# Patient Record
Sex: Male | Born: 1990 | Race: White | Hispanic: No | Marital: Single | Smoking: Never smoker
Health system: Southern US, Community
[De-identification: ages and names within clinical notes are randomized; demographics above are authoritative.]

## PROBLEM LIST (undated history)

## (undated) DIAGNOSIS — Z87898 Personal history of other specified conditions: Secondary | ICD-10-CM

## (undated) DIAGNOSIS — K4090 Unilateral inguinal hernia, without obstruction or gangrene, not specified as recurrent: Secondary | ICD-10-CM

## (undated) DIAGNOSIS — M26629 Arthralgia of temporomandibular joint, unspecified side: Secondary | ICD-10-CM

## (undated) HISTORY — PX: HERNIA REPAIR: SHX51

---
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
## 2013-01-17 ENCOUNTER — Other Ambulatory Visit: Payer: Self-pay | Admitting: *Deleted

## 2013-01-17 DIAGNOSIS — R1030 Lower abdominal pain, unspecified: Secondary | ICD-10-CM

## 2013-01-19 ENCOUNTER — Other Ambulatory Visit: Payer: Self-pay | Admitting: *Deleted

## 2013-01-19 DIAGNOSIS — R1030 Lower abdominal pain, unspecified: Secondary | ICD-10-CM

## 2013-01-20 ENCOUNTER — Other Ambulatory Visit: Payer: Self-pay

## 2013-01-25 ENCOUNTER — Ambulatory Visit
Admission: RE | Admit: 2013-01-25 | Discharge: 2013-01-25 | Disposition: A | Discharge status: Home / Self Care | Payer: Managed Care, Other (non HMO) | Source: Ambulatory Visit | Attending: *Deleted | Admitting: *Deleted

## 2013-01-25 DIAGNOSIS — R1030 Lower abdominal pain, unspecified: Secondary | ICD-10-CM

## 2013-01-30 ENCOUNTER — Encounter (INDEPENDENT_AMBULATORY_CARE_PROVIDER_SITE_OTHER): Payer: Self-pay | Admitting: Surgery

## 2013-01-30 DIAGNOSIS — K4090 Unilateral inguinal hernia, without obstruction or gangrene, not specified as recurrent: Secondary | ICD-10-CM

## 2013-01-30 HISTORY — DX: Unilateral inguinal hernia, without obstruction or gangrene, not specified as recurrent: K40.90

## 2013-01-31 ENCOUNTER — Ambulatory Visit (INDEPENDENT_AMBULATORY_CARE_PROVIDER_SITE_OTHER): Payer: Managed Care, Other (non HMO) | Admitting: Surgery

## 2013-02-02 ENCOUNTER — Ambulatory Visit (INDEPENDENT_AMBULATORY_CARE_PROVIDER_SITE_OTHER): Payer: Managed Care, Other (non HMO) | Admitting: General Surgery

## 2013-02-10 ENCOUNTER — Encounter (INDEPENDENT_AMBULATORY_CARE_PROVIDER_SITE_OTHER): Payer: Self-pay | Admitting: Surgery

## 2013-02-10 ENCOUNTER — Encounter (INDEPENDENT_AMBULATORY_CARE_PROVIDER_SITE_OTHER): Payer: Self-pay

## 2013-02-10 ENCOUNTER — Ambulatory Visit (INDEPENDENT_AMBULATORY_CARE_PROVIDER_SITE_OTHER): Payer: Managed Care, Other (non HMO) | Admitting: Surgery

## 2013-02-10 VITALS — BP 124/62 | HR 74 | Temp 98.0°F | Resp 18 | Ht 72.0 in | Wt 202.0 lb

## 2013-02-10 DIAGNOSIS — K4090 Unilateral inguinal hernia, without obstruction or gangrene, not specified as recurrent: Secondary | ICD-10-CM | POA: Insufficient documentation

## 2013-02-10 NOTE — Progress Notes (Signed)
Patient ID: Jaime Glover, male   DOB: February 22, 1991, 22 y.o.   MRN: [id number]  Chief Complaint  Patient presents with  . New Evaluation    hernia    HPI Jaime Glover is a 22 y.o. male.  Referred by Jaime Fairly, MD at [facility] for [name]  HPI This is a 22 year old male who is a senior at [employer] who was recently working out when he noticed of folds in his left groin. This area remains reducible. It causes some discomfort when he is standing up. He denies any obstructive symptoms. He had an ultrasound that confirmed a left inguinal hernia. He is now referred for surgical evaluation. He is home in [place] on Christmas break.  History reviewed. No pertinent past medical history.  Past Surgical History  Procedure Laterality Date  . Shoulder arthroscopy  2008    both  . Hand surgery  2010    right hand fx    History reviewed. No pertinent family history.  Social History History  Substance Use Topics  . Smoking status: Never Smoker   . Smokeless tobacco: Not on file  . Alcohol Use: Not on file    No Known Allergies  Current Outpatient Prescriptions  Medication Sig Dispense Refill  . doxycycline (VIBRAMYCIN) 100 MG capsule       . EPIDUO 0.1-2.5 % gel        No current facility-administered medications for this visit.    Review of Systems Review of Systems  Constitutional: Negative for fever, chills and unexpected weight change.  HENT: Negative for congestion, hearing loss, sore throat, trouble swallowing and voice change.   Eyes: Negative for visual disturbance.  Respiratory: Negative for cough and wheezing.   Cardiovascular: Negative for chest pain, palpitations and leg swelling.  Gastrointestinal: Negative for nausea, vomiting, abdominal pain, diarrhea, constipation, blood in stool, abdominal distention, anal bleeding and rectal pain.  Genitourinary: Positive for scrotal swelling. Negative for hematuria and difficulty urinating.   Musculoskeletal: Negative for arthralgias.  Skin: Negative for rash and wound.  Neurological: Negative for seizures, syncope, weakness and headaches.  Hematological: Negative for adenopathy. Does not bruise/bleed easily.  Psychiatric/Behavioral: Negative for confusion.    Blood pressure 124/62, pulse 74, temperature 98 F (36.7 C), resp. rate 18, height 6' (1.829 m), weight 202 lb (91.627 kg).  Physical Exam Physical Exam WDWN in NAD HEENT:  EOMI, sclera anicteric Neck:  No masses, no thyromegaly Lungs:  CTA bilaterally; normal respiratory effort CV:  Regular rate and rhythm; no murmurs Abd:  +bowel sounds, soft, non-tender, no masses GU:  Bilateral descended testes; no testicular masses; reducible left inguinal hernia; no right inguinal hernia Ext:  Well-perfused; no edema Skin:  Warm, dry; no sign of jaundice  Data Reviewed US Pelvis Limited  01/25/2013   CLINICAL DATA:  Bulging in left inguinal region, evaluate for hernia  EXAM: US PELVIS LIMITED  TECHNIQUE: Ultrasound examination of the pelvic soft tissues was performed in the area of clinical concern.  COMPARISON:  None.  FINDINGS: Initial evaluation of the left inguinal region is without sonographic abnormality.  However, with Valsalva, a small fat containing left inguinal hernia is suspected.  No mass or adenopathy is visualized.  IMPRESSION: Suspected small fat containing left inguinal hernia with Valsalva.   Electronically Signed   By: Charline Bills M.D.   On: 01/25/2013 08:46      Assessment    Left inguinal hernia - reducible     Plan  Left inguinal hernia repair with mesh.  The surgical procedure has been discussed with the patient.  Potential risks, benefits, alternative treatments, and expected outcomes have been explained.  All of the patient's questions at this time have been answered.  The likelihood of reaching the patient's treatment goal is good.  The patient understand the proposed surgical procedure  and wishes to proceed.         Kye Silverstein K. 02/10/2013, 11:10 AM

## 2013-02-21 ENCOUNTER — Encounter (HOSPITAL_BASED_OUTPATIENT_CLINIC_OR_DEPARTMENT_OTHER): Payer: Self-pay | Admitting: *Deleted

## 2013-03-01 ENCOUNTER — Encounter (HOSPITAL_BASED_OUTPATIENT_CLINIC_OR_DEPARTMENT_OTHER)
Admission: RE | Disposition: A | Discharge status: Home / Self Care | Payer: Self-pay | Source: Ambulatory Visit | Attending: Surgery

## 2013-03-01 ENCOUNTER — Encounter (HOSPITAL_BASED_OUTPATIENT_CLINIC_OR_DEPARTMENT_OTHER): Payer: Self-pay

## 2013-03-01 ENCOUNTER — Ambulatory Visit (HOSPITAL_BASED_OUTPATIENT_CLINIC_OR_DEPARTMENT_OTHER): Payer: Managed Care, Other (non HMO) | Admitting: Anesthesiology

## 2013-03-01 ENCOUNTER — Ambulatory Visit (HOSPITAL_BASED_OUTPATIENT_CLINIC_OR_DEPARTMENT_OTHER)
Admission: RE | Admit: 2013-03-01 | Discharge: 2013-03-01 | Disposition: A | Discharge status: Home / Self Care | Payer: Managed Care, Other (non HMO) | Source: Ambulatory Visit | Attending: Surgery | Admitting: Surgery

## 2013-03-01 ENCOUNTER — Encounter (HOSPITAL_BASED_OUTPATIENT_CLINIC_OR_DEPARTMENT_OTHER): Payer: Managed Care, Other (non HMO) | Admitting: Anesthesiology

## 2013-03-01 DIAGNOSIS — K4090 Unilateral inguinal hernia, without obstruction or gangrene, not specified as recurrent: Secondary | ICD-10-CM

## 2013-03-01 HISTORY — DX: Unilateral inguinal hernia, without obstruction or gangrene, not specified as recurrent: K40.90

## 2013-03-01 HISTORY — PX: INSERTION OF MESH: SHX5868

## 2013-03-01 HISTORY — DX: Personal history of other specified conditions: Z87.898

## 2013-03-01 HISTORY — PX: INGUINAL HERNIA REPAIR: SHX194

## 2013-03-01 HISTORY — DX: Arthralgia of temporomandibular joint, unspecified side: M26.629

## 2013-03-01 SURGERY — REPAIR, HERNIA, INGUINAL, ADULT
Anesthesia: General | Site: Abdomen | Laterality: Left

## 2013-03-01 MED ORDER — CEFAZOLIN SODIUM 1-5 GM-% IV SOLN
INTRAVENOUS | Status: AC
  Filled 2013-03-01: qty 100

## 2013-03-01 MED ORDER — LACTATED RINGERS IV SOLN
INTRAVENOUS | Status: DC
  Administered 2013-03-01 (×2): via INTRAVENOUS

## 2013-03-01 MED ORDER — MIDAZOLAM HCL 2 MG/2ML IJ SOLN
INTRAMUSCULAR | Status: AC
  Filled 2013-03-01: qty 2

## 2013-03-01 MED ORDER — OXYCODONE HCL 5 MG/5ML PO SOLN: 5.0000 mg | Freq: Once | ORAL | Status: DC | PRN

## 2013-03-01 MED ORDER — OXYCODONE HCL 5 MG PO TABS
10.0000 mg | ORAL_TABLET | Freq: Once | ORAL | Status: AC | PRN
  Administered 2013-03-01: 10 mg via ORAL

## 2013-03-01 MED ORDER — MIDAZOLAM HCL 2 MG/ML PO SYRP: 12.0000 mg | ORAL_SOLUTION | Freq: Once | ORAL | Status: DC | PRN

## 2013-03-01 MED ORDER — ONDANSETRON HCL 4 MG/2ML IJ SOLN: 4.0000 mg | INTRAMUSCULAR | Status: DC | PRN

## 2013-03-01 MED ORDER — FENTANYL CITRATE 0.05 MG/ML IJ SOLN
50.0000 ug | INTRAMUSCULAR | Status: DC | PRN
  Administered 2013-03-01: 100 ug via INTRAVENOUS

## 2013-03-01 MED ORDER — CHLORHEXIDINE GLUCONATE 4 % EX LIQD: 1.0000 "application " | Freq: Once | CUTANEOUS | Status: DC

## 2013-03-01 MED ORDER — HYDROCODONE-IBUPROFEN 10-200 MG PO TABS: 1.0000 | ORAL_TABLET | ORAL | Status: DC | PRN

## 2013-03-01 MED ORDER — BUPIVACAINE-EPINEPHRINE PF 0.5-1:200000 % IJ SOLN
INTRAMUSCULAR | Status: DC | PRN
  Administered 2013-03-01: 25 mL via PERINEURAL

## 2013-03-01 MED ORDER — ONDANSETRON HCL 4 MG/2ML IJ SOLN
INTRAMUSCULAR | Status: DC | PRN
  Administered 2013-03-01: 4 mg via INTRAVENOUS

## 2013-03-01 MED ORDER — CEFAZOLIN SODIUM-DEXTROSE 2-3 GM-% IV SOLR
2.0000 g | INTRAVENOUS | Status: AC
  Administered 2013-03-01: 2 g via INTRAVENOUS

## 2013-03-01 MED ORDER — BUPIVACAINE HCL (PF) 0.25 % IJ SOLN
INTRAMUSCULAR | Status: AC
  Filled 2013-03-01: qty 30

## 2013-03-01 MED ORDER — DEXAMETHASONE SODIUM PHOSPHATE 4 MG/ML IJ SOLN
INTRAMUSCULAR | Status: DC | PRN
  Administered 2013-03-01: 10 mg via INTRAVENOUS

## 2013-03-01 MED ORDER — OXYCODONE HCL 5 MG PO TABS: 5.0000 mg | ORAL_TABLET | Freq: Once | ORAL | Status: DC | PRN

## 2013-03-01 MED ORDER — BUPIVACAINE-EPINEPHRINE 0.5% -1:200000 IJ SOLN
INTRAMUSCULAR | Status: DC | PRN
  Administered 2013-03-01: 10 mL

## 2013-03-01 MED ORDER — MIDAZOLAM HCL 2 MG/2ML IJ SOLN
1.0000 mg | INTRAMUSCULAR | Status: DC | PRN
  Administered 2013-03-01: 2 mg via INTRAVENOUS

## 2013-03-01 MED ORDER — MIDAZOLAM HCL 5 MG/5ML IJ SOLN
INTRAMUSCULAR | Status: DC | PRN
  Administered 2013-03-01: 2 mg via INTRAVENOUS

## 2013-03-01 MED ORDER — LIDOCAINE HCL (CARDIAC) 20 MG/ML IV SOLN
INTRAVENOUS | Status: DC | PRN
  Administered 2013-03-01: 50 mg via INTRAVENOUS

## 2013-03-01 MED ORDER — FENTANYL CITRATE 0.05 MG/ML IJ SOLN
INTRAMUSCULAR | Status: DC | PRN
  Administered 2013-03-01: 50 ug via INTRAVENOUS

## 2013-03-01 MED ORDER — OXYCODONE HCL 5 MG PO TABS
ORAL_TABLET | ORAL | Status: AC
  Filled 2013-03-01: qty 2

## 2013-03-01 MED ORDER — BUPIVACAINE-EPINEPHRINE PF 0.5-1:200000 % IJ SOLN
INTRAMUSCULAR | Status: AC
  Filled 2013-03-01: qty 30

## 2013-03-01 MED ORDER — HYDROMORPHONE HCL PF 1 MG/ML IJ SOLN: 0.2500 mg | INTRAMUSCULAR | Status: DC | PRN

## 2013-03-01 MED ORDER — FENTANYL CITRATE 0.05 MG/ML IJ SOLN
INTRAMUSCULAR | Status: AC
  Filled 2013-03-01: qty 6

## 2013-03-01 MED ORDER — PROPOFOL 10 MG/ML IV BOLUS
INTRAVENOUS | Status: DC | PRN
  Administered 2013-03-01: 400 mg via INTRAVENOUS

## 2013-03-01 MED ORDER — OXYCODONE-ACETAMINOPHEN 5-325 MG PO TABS: 1.0000 | ORAL_TABLET | ORAL | Status: DC | PRN

## 2013-03-01 MED ORDER — MORPHINE SULFATE 2 MG/ML IJ SOLN: 2.0000 mg | INTRAMUSCULAR | Status: DC | PRN

## 2013-03-01 MED ORDER — FENTANYL CITRATE 0.05 MG/ML IJ SOLN
INTRAMUSCULAR | Status: AC
  Filled 2013-03-01: qty 2

## 2013-03-01 MED ORDER — ONDANSETRON HCL 4 MG/2ML IJ SOLN: 4.0000 mg | Freq: Once | INTRAMUSCULAR | Status: DC | PRN

## 2013-03-01 SURGICAL SUPPLY — 54 items
APL SKNCLS STERI-STRIP NONHPOA (GAUZE/BANDAGES/DRESSINGS) ×1
BENZOIN TINCTURE PRP APPL 2/3 (GAUZE/BANDAGES/DRESSINGS) ×2 IMPLANT
BLADE HEX COATED 2.75 (ELECTRODE) ×2 IMPLANT
BLADE SURG 15 STRL LF DISP TIS (BLADE) ×1 IMPLANT
BLADE SURG 15 STRL SS (BLADE) ×2
BLADE SURG ROTATE 9660 (MISCELLANEOUS) ×1 IMPLANT
CANISTER SUCT 1200ML W/VALVE (MISCELLANEOUS) IMPLANT
CHLORAPREP W/TINT 26ML (MISCELLANEOUS) ×2 IMPLANT
COVER MAYO STAND STRL (DRAPES) ×2 IMPLANT
COVER TABLE BACK 60X90 (DRAPES) ×2 IMPLANT
DECANTER SPIKE VIAL GLASS SM (MISCELLANEOUS) ×2 IMPLANT
DRAIN PENROSE 1/2X12 LTX STRL (WOUND CARE) ×2 IMPLANT
DRAPE LAPAROTOMY TRNSV 102X78 (DRAPE) ×2 IMPLANT
DRAPE UTILITY XL STRL (DRAPES) ×2 IMPLANT
DRSG TEGADERM 4X4.75 (GAUZE/BANDAGES/DRESSINGS) ×2 IMPLANT
ELECT REM PT RETURN 9FT ADLT (ELECTROSURGICAL) ×2
ELECTRODE REM PT RTRN 9FT ADLT (ELECTROSURGICAL) ×1 IMPLANT
GLOVE BIO SURGEON STRL SZ 6.5 (GLOVE) ×1 IMPLANT
GLOVE BIO SURGEON STRL SZ7 (GLOVE) ×2 IMPLANT
GLOVE BIOGEL PI IND STRL 7.0 (GLOVE) IMPLANT
GLOVE BIOGEL PI IND STRL 7.5 (GLOVE) ×1 IMPLANT
GLOVE BIOGEL PI INDICATOR 7.0 (GLOVE) ×1
GLOVE BIOGEL PI INDICATOR 7.5 (GLOVE) ×1
GLOVE EXAM NITRILE MD LF STRL (GLOVE) ×1 IMPLANT
GOWN PREVENTION PLUS XLARGE (GOWN DISPOSABLE) ×4 IMPLANT
MESH PARIETEX PROGRIP LEFT (Mesh General) ×1 IMPLANT
NDL HYPO 25X1 1.5 SAFETY (NEEDLE) ×1 IMPLANT
NEEDLE HYPO 25X1 1.5 SAFETY (NEEDLE) ×2 IMPLANT
NS IRRIG 1000ML POUR BTL (IV SOLUTION) ×1 IMPLANT
PACK BASIN DAY SURGERY FS (CUSTOM PROCEDURE TRAY) ×2 IMPLANT
PENCIL BUTTON HOLSTER BLD 10FT (ELECTRODE) ×2 IMPLANT
SLEEVE SCD COMPRESS KNEE MED (MISCELLANEOUS) ×2 IMPLANT
SPONGE GAUZE 4X4 12PLY STER LF (GAUZE/BANDAGES/DRESSINGS) ×2 IMPLANT
SPONGE INTESTINAL PEANUT (DISPOSABLE) ×2 IMPLANT
SPONGE LAP 18X18 X RAY DECT (DISPOSABLE) IMPLANT
STRIP CLOSURE SKIN 1/2X4 (GAUZE/BANDAGES/DRESSINGS) ×2 IMPLANT
SUT MON AB 4-0 PC3 18 (SUTURE) ×2 IMPLANT
SUT PDS AB 0 CT 36 (SUTURE) IMPLANT
SUT SILK 2 0 SH (SUTURE) IMPLANT
SUT SILK 3 0 SH 30 (SUTURE) IMPLANT
SUT SILK 3 0 TIES 17X18 (SUTURE)
SUT SILK 3-0 18XBRD TIE BLK (SUTURE) IMPLANT
SUT VIC AB 0 CT1 27 (SUTURE) ×2
SUT VIC AB 0 CT1 27XBRD ANBCTR (SUTURE) ×1 IMPLANT
SUT VIC AB 0 SH 27 (SUTURE) IMPLANT
SUT VIC AB 2-0 SH 27 (SUTURE) ×2
SUT VIC AB 2-0 SH 27XBRD (SUTURE) ×1 IMPLANT
SUT VIC AB 3-0 SH 27 (SUTURE) ×2
SUT VIC AB 3-0 SH 27X BRD (SUTURE) ×1 IMPLANT
SYR CONTROL 10ML LL (SYRINGE) ×2 IMPLANT
TOWEL OR 17X24 6PK STRL BLUE (TOWEL DISPOSABLE) ×2 IMPLANT
TOWEL OR NON WOVEN STRL DISP B (DISPOSABLE) ×2 IMPLANT
TUBE CONNECTING 20X1/4 (TUBING) IMPLANT
YANKAUER SUCT BULB TIP NO VENT (SUCTIONS) IMPLANT

## 2013-03-01 NOTE — Op Note (Signed)
Hernia, Open, Procedure Note  Indications: The patient presented with a history of a left, reducible inguinal hernia.    Pre-operative Diagnosis: left reducible inguinal hernia Post-operative Diagnosis: same  Surgeon: Wynona Luna.   Assistants: none  Anesthesia: General LMA anesthesia and TAP block  ASA Class: 1  Procedure Details  The patient was seen again in the Holding Room. The risks, benefits, complications, treatment options, and expected outcomes were discussed with the patient. The possibilities of reaction to medication, pulmonary aspiration, perforation of viscus, bleeding, recurrent infection, the need for additional procedures, and development of a complication requiring transfusion or further operation were discussed with the patient and/or family. The likelihood of success in repairing the hernia and returning the patient to their previous functional status is good.  There was concurrence with the proposed plan, and informed consent was obtained. The site of surgery was properly noted/marked. The patient was taken to the Operating Room, identified as Jaime Glover, and the procedure verified as left inguinal hernia repair. A Time Out was held and the above information confirmed.  The patient was placed in the supine position and underwent induction of anesthesia. The lower abdomen and groin was prepped with Chloraprep and draped in the standard fashion, and 0.25% Marcaine with epinephrine was used to anesthetize the skin over the mid-portion of the inguinal canal. An oblique incision was made. Dissection was carried down through the subcutaneous tissue with cautery to the external oblique fascia.  We opened the external oblique fascia along the direction of its fibers to the external ring.  The spermatic cord was circumferentially dissected bluntly and retracted with a Penrose drain.  The ilioinguinal nerve was identified and preserved.  The floor of the inguinal canal was  inspected and was intact.  We skeletonized the spermatic cord and reduced a moderate-sized indirect hernia sac.  The internal ring was tightened with 0 Vicryl.  We used a left-sided Progrip mesh which was inserted and deployed across the floor of the inguinal canal. The mesh was tucked underneath the external oblique fascia laterally.  The flap of the mesh was closed around the spermatic cord to recreate the internal inguinal ring.  The mesh was secured to the pubic tubercle with 0 Vicryl.  The external oblique fascia was reapproximated with 2-0 Vicryl.  3-0 Vicryl was used to close the subcutaneous tissues and 4-0 Monocryl was used to close the skin in subcuticular fashion.  Benzoin and steri-strips were used to seal the incision.  A clean dressing was applied.  The patient was then extubated and brought to the recovery room in stable condition.  All sponge, instrument, and needle counts were correct prior to closure and at the conclusion of the case.   Estimated Blood Loss: Minimal                 Complications: None; patient tolerated the procedure well.         Disposition: PACU - hemodynamically stable.         Condition: stable  Jaime Glover. Corliss Skains, MD, [facility]  General/ Trauma Surgery  03/01/2013 1:17 PM

## 2013-03-01 NOTE — Progress Notes (Signed)
Assisted Dr. Crews with left, ultrasound guided, transabdominal plane block. Side rails up, monitors on throughout procedure. See vital signs in flow sheet. Tolerated Procedure well. 

## 2013-03-01 NOTE — H&P (Signed)
HPI  Jaime Glover is a 22 y.o. male. 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 This area remains reducible. It causes some discomfort when he is standing up. He denies any obstructive symptoms. He had an ultrasound that confirmed a left inguinal hernia. He is now referred for surgical evaluation. He is home in [place] on Christmas break.   History reviewed. No pertinent past medical history.  Past Surgical History   Procedure  Laterality  Date   .  Shoulder arthroscopy   2008     both   .  Hand surgery   2010     right hand fx   History reviewed. No pertinent family history.  Social History  History   Substance Use Topics   .  Smoking status:  Never Smoker   .  Smokeless tobacco:  Not on file   .  Alcohol Use:  Not on file   No Known Allergies  Current Outpatient Prescriptions   Medication  Sig  Dispense  Refill   .  doxycycline (VIBRAMYCIN) 100 MG capsule      .  EPIDUO 0.1-2.5 % gel       No current facility-administered medications for this visit.   Review of Systems  Review of Systems  Constitutional: Negative for fever, chills and unexpected weight change.  HENT: Negative for congestion, hearing loss, sore throat, trouble swallowing and voice change.  Eyes: Negative for visual disturbance.  Respiratory: Negative for cough and wheezing.  Cardiovascular: Negative for chest pain, palpitations and leg swelling.  Gastrointestinal: Negative for nausea, vomiting, abdominal pain, diarrhea, constipation, blood in stool, abdominal distention, anal bleeding and rectal pain.  Genitourinary: Positive for scrotal swelling. Negative for hematuria and difficulty urinating.  Musculoskeletal: Negative for arthralgias.  Skin: Negative for rash and wound.  Neurological: Negative for seizures,  syncope, weakness and headaches.  Hematological: Negative for adenopathy. Does not bruise/bleed easily.  Psychiatric/Behavioral: Negative for confusion.  Blood pressure 124/62, pulse 74, temperature 98 F (36.7 C), resp. rate 18, height 6' (1.829 m), weight 202 lb (91.627 kg).  Physical Exam  Physical Exam  WDWN in NAD  HEENT: EOMI, sclera anicteric  Neck: No masses, no thyromegaly  Lungs: CTA bilaterally; normal respiratory effort  CV: Regular rate and rhythm; no murmurs  Abd: +bowel sounds, soft, non-tender, no masses  GU: Bilateral descended testes; no testicular masses; reducible left inguinal hernia; no right inguinal hernia  Ext: Well-perfused; no edema  Skin: Warm, dry; no sign of jaundice  Data Reviewed  US Pelvis Limited  01/25/2013 CLINICAL DATA: Bulging in left inguinal region, evaluate for hernia EXAM: US PELVIS LIMITED TECHNIQUE: Ultrasound examination of the pelvic soft tissues was performed in the area of clinical concern. COMPARISON: None. FINDINGS: Initial evaluation of the left inguinal region is without sonographic abnormality. However, with Valsalva, a small fat containing left inguinal hernia is suspected. No mass or adenopathy is visualized. IMPRESSION: Suspected small fat containing left inguinal hernia with Valsalva. Electronically Signed By: Charline Bills M.D. On: 01/25/2013 08:46  Assessment  Left inguinal hernia - reducible  Plan  Left inguinal hernia repair with mesh. The surgical procedure has been discussed with the patient. Potential risks, benefits, alternative treatments, and expected outcomes have been explained. All of the patient's questions at this time have  been answered. The likelihood of reaching the patient's treatment goal is good. The patient understand the proposed surgical procedure and wishes to proceed.   Wilmon Arms. Corliss Skains, MD, [facility]  General/ Trauma Surgery  03/01/2013 11:53 AM

## 2013-03-01 NOTE — Anesthesia Preprocedure Evaluation (Signed)

## 2013-03-01 NOTE — Anesthesia Procedure Notes (Addendum)
Anesthesia Regional Block:  TAP block  Pre-Anesthetic Checklist: ,, timeout performed, Correct Patient, Correct Site, Correct Laterality, Correct Procedure, Correct Position, site marked, Risks and benefits discussed,  Surgical consent,  Pre-op evaluation,  At surgeon's request and post-op pain management  Laterality: Left and Lower  Prep: chloraprep       Needles:  Injection technique: Single-shot  Needle Type: Echogenic Needle     Needle Length: 9cm  Needle Gauge: 21 and 21 G    Additional Needles:  Procedures: ultrasound guided (picture in chart) TAP block Narrative:  Start time: 03/01/2013 12:10 PM End time: 03/01/2013 12:18 PM Injection made incrementally with aspirations every 5 mL.  Performed by: Personally  Anesthesiologist: Sheldon Silvan, MD   Procedure Name: LMA Insertion Date/Time: 03/01/2013 12:27 PM Performed by: Caren Macadam Pre-anesthesia Checklist: Patient identified, Emergency Drugs available, Suction available and Patient being monitored Patient Re-evaluated:Patient Re-evaluated prior to inductionOxygen Delivery Method: Circle System Utilized Preoxygenation: Pre-oxygenation with 100% oxygen Intubation Type: IV induction Ventilation: Mask ventilation without difficulty LMA: LMA inserted LMA Size: 5.0 Number of attempts: 1 Airway Equipment and Method: bite block Placement Confirmation: positive ETCO2 and breath sounds checked- equal and bilateral Tube secured with: Tape Dental Injury: Teeth and Oropharynx as per pre-operative assessment

## 2013-03-01 NOTE — Transfer of Care (Signed)
Immediate Anesthesia Transfer of Care Note  Patient: Jaime Glover  Procedure(s) Performed: Procedure(s): LEFT INGUINAL HERNIA REPAIR WITH MESH (Left) INSERTION OF MESH (Left)  Patient Location: PACU General and GA combined with regional for post-op pain Anesthesia Type:General and GA combined with regional for post-op pain  Level of Consciousness: sedated  Airway & Oxygen Therapy: Patient Spontanous Breathing and Patient connected to face mask oxygen  Post-op Assessment: Report given to PACU RN and Post -op Vital signs reviewed and stable  Post vital signs: Reviewed and stable  Complications: No apparent anesthesia complications

## 2013-03-01 NOTE — Anesthesia Postprocedure Evaluation (Signed)
  Anesthesia Post-op Note  Patient: Jaime Glover  Procedure(s) Performed: Procedure(s): LEFT INGUINAL HERNIA REPAIR WITH MESH (Left) INSERTION OF MESH (Left)  Patient Location: PACU  Anesthesia Type:GA combined with regional for post-op pain  Level of Consciousness: awake, alert  and oriented  Airway and Oxygen Therapy: Patient Spontanous Breathing  Post-op Pain: mild  Post-op Assessment: Post-op Vital signs reviewed  Post-op Vital Signs: Reviewed  Complications: No apparent anesthesia complications

## 2013-03-03 ENCOUNTER — Encounter (HOSPITAL_BASED_OUTPATIENT_CLINIC_OR_DEPARTMENT_OTHER): Payer: Self-pay | Admitting: Surgery

## 2013-03-21 ENCOUNTER — Ambulatory Visit (INDEPENDENT_AMBULATORY_CARE_PROVIDER_SITE_OTHER): Payer: BC Managed Care – PPO | Admitting: Surgery

## 2013-03-21 ENCOUNTER — Encounter (INDEPENDENT_AMBULATORY_CARE_PROVIDER_SITE_OTHER): Payer: Self-pay | Admitting: Surgery

## 2013-03-21 VITALS — BP 118/72 | HR 80 | Temp 97.2°F | Resp 14 | Ht 72.0 in | Wt 198.8 lb

## 2013-03-21 DIAGNOSIS — K4090 Unilateral inguinal hernia, without obstruction or gangrene, not specified as recurrent: Secondary | ICD-10-CM

## 2013-03-21 NOTE — Progress Notes (Signed)
Status post left internal hernia repair with mesh on 03/01/13. The patient had a moderate-sized indirect hernia. This is healing quite well. No sign of recurrence. No sign of infection. Minimal scarring. He should continue limiting his level of activity for the next 3 weeks. Once she reaches 6 weeks he may resume full activity. Followup as needed.  Wilmon ArmsMatthew K. Corliss Skainssuei, MD, [facility]  General/ Trauma Surgery  03/21/2013 9:54 AM

## 2013-07-17 ENCOUNTER — Telehealth (INDEPENDENT_AMBULATORY_CARE_PROVIDER_SITE_OTHER): Payer: Self-pay

## 2013-07-17 NOTE — Telephone Encounter (Signed)
Pt s/p hernia repair on 03/01/13. A couple days ago he noticed a pop and has had some pain. Advised pt to take tylenol and ibuprofen as needed and to apply heat or ice on the area to help with pain. 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 Made a follow/up with Dr Corliss Skainssuei

## 2013-07-25 ENCOUNTER — Ambulatory Visit (INDEPENDENT_AMBULATORY_CARE_PROVIDER_SITE_OTHER): Payer: BC Managed Care – PPO | Admitting: Surgery

## 2013-07-25 ENCOUNTER — Encounter (INDEPENDENT_AMBULATORY_CARE_PROVIDER_SITE_OTHER): Payer: Self-pay | Admitting: Surgery

## 2013-07-25 VITALS — BP 118/84 | HR 78 | Temp 97.2°F | Resp 14 | Ht 72.0 in | Wt 207.6 lb

## 2013-07-25 DIAGNOSIS — R1032 Left lower quadrant pain: Secondary | ICD-10-CM | POA: Insufficient documentation

## 2013-07-25 DIAGNOSIS — R109 Unspecified abdominal pain: Secondary | ICD-10-CM

## 2013-07-25 NOTE — Progress Notes (Signed)
The patient is status post left inguinal hernia repair with mesh on 03/01/13. He has been doing quite well. A few weeks ago he was stretching and felt a pulling sensation in his left groin. It has been moderately uncomfortable since that time. No swelling in this area. He just wanted to calm and have it rechecked.  On examination his incision is well-healed. There is no sign of recurrent hernia on the left when he is standing with Valsalva maneuver. No sign of right inguinal hernia when he is standing with Valsalva maneuver.  I reassured him that there was no sign of recurrent inguinal hernia. He should use ibuprofen or Aleve for the next couple of weeks and should scale back his exercise intensity. Once the soreness is gone, he may resume full activity. Followup as needed.  Wilmon Arms. Corliss Skains, MD, [facility]  General/ Trauma Surgery  07/25/2013 11:37 AM

## 2013-08-14 ENCOUNTER — Ambulatory Visit (INDEPENDENT_AMBULATORY_CARE_PROVIDER_SITE_OTHER): Payer: BC Managed Care – PPO | Admitting: Surgery

## 2014-04-07 IMAGING — US US PELVIS LIMITED
1 series · 12 of 12 positions shown · non-contrast
Comparison: None.

CLINICAL DATA: Bulging in left inguinal region, evaluate for hernia

EXAM:
US PELVIS LIMITED
TECHNIQUE: Ultrasound examination of the pelvic soft tissues was performed in
the area of clinical concern.

[Series 1: us pelvis limited · 0.11mm/px · 12 acquisitions, 12 frames shown]
[im 1/12]
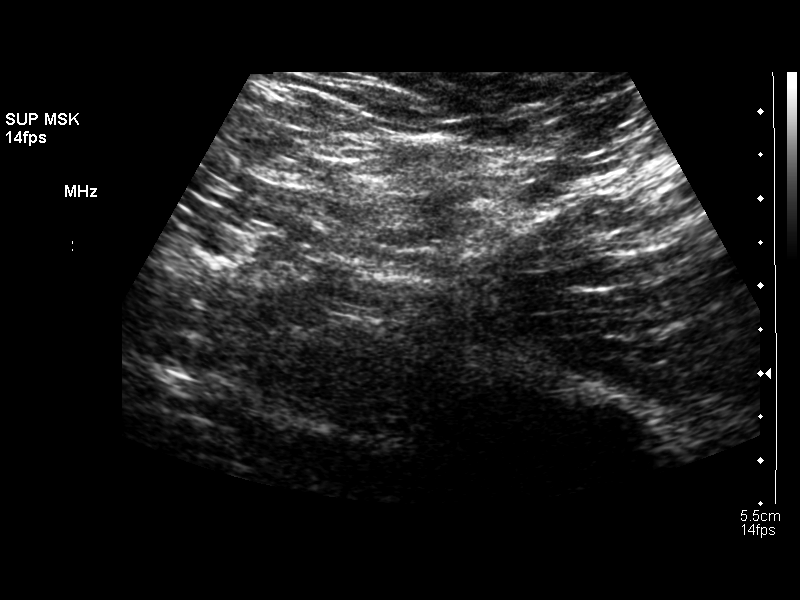
[im 2/12]
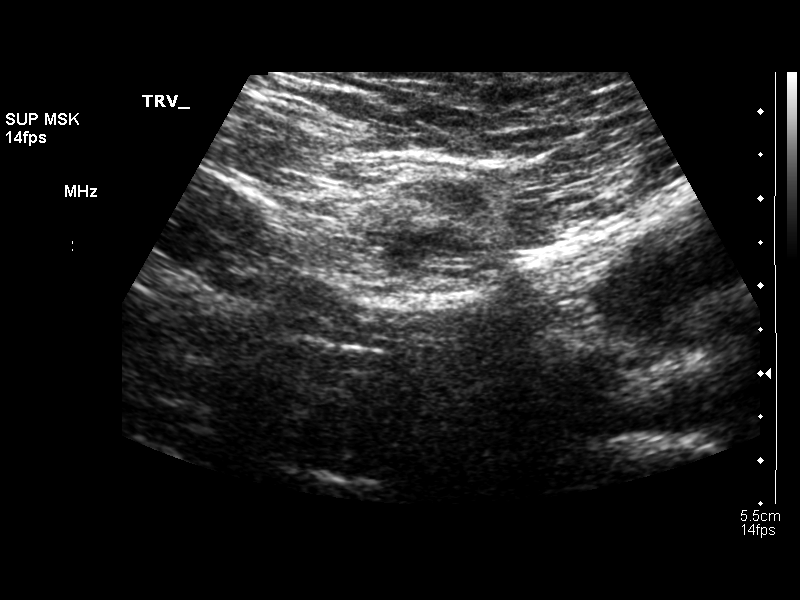
[im 3/12]
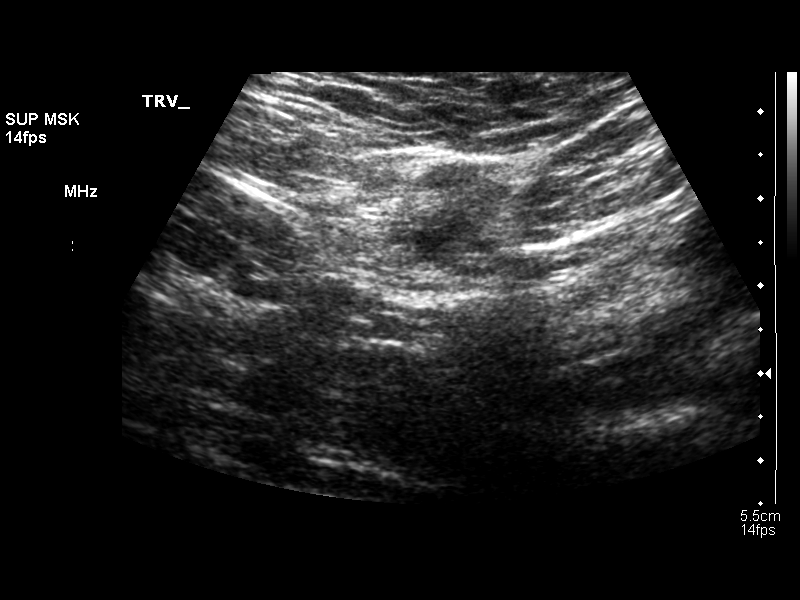
[im 4/12]
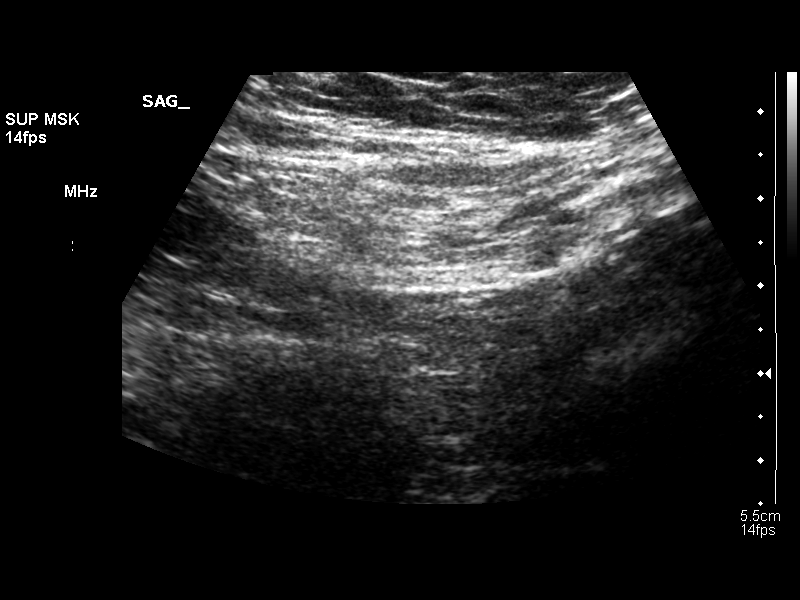
[im 5/12]
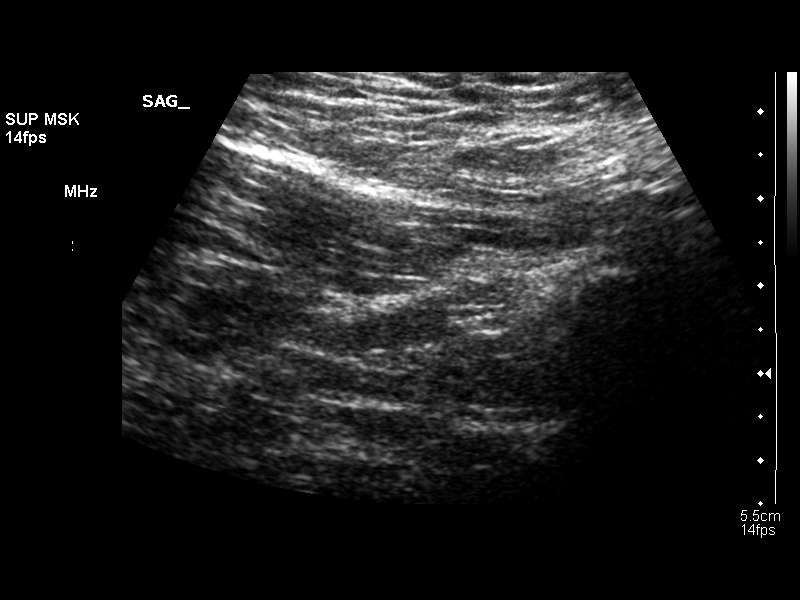
[im 6/12]
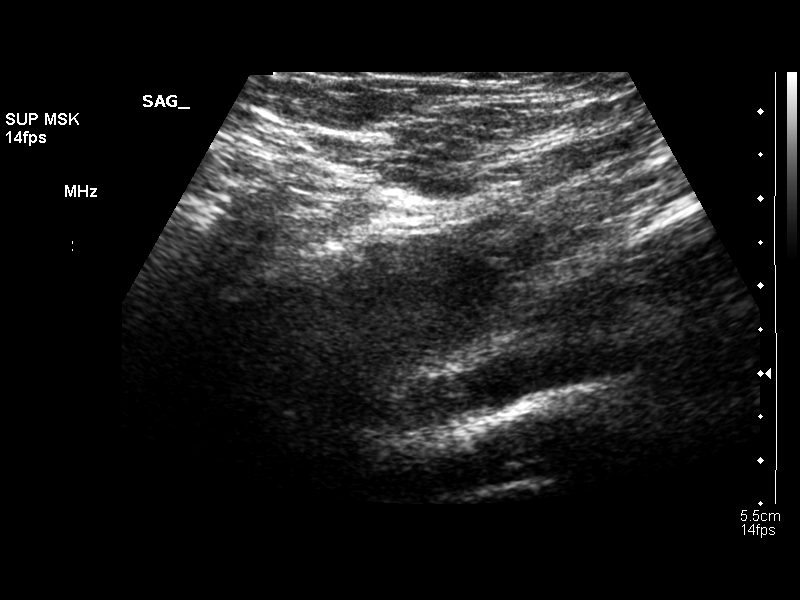
[im 7/12]
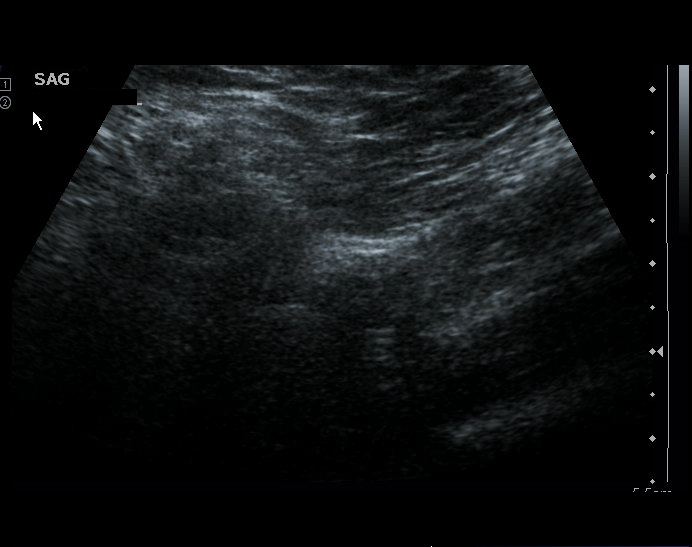
[im 8/12]
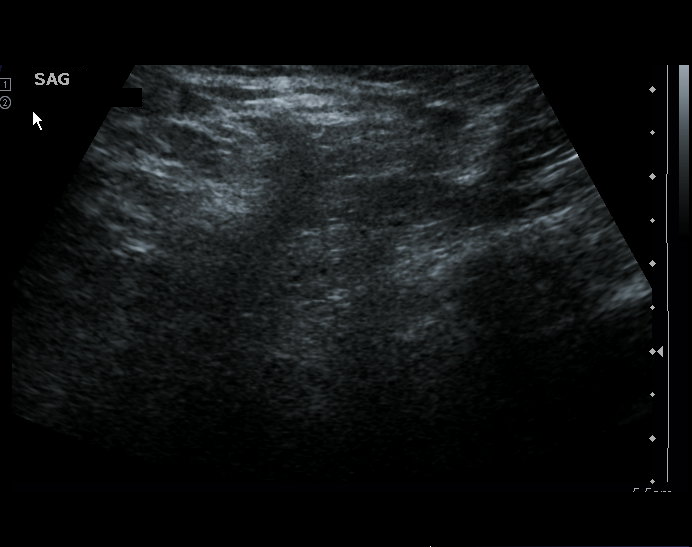
[im 9/12]
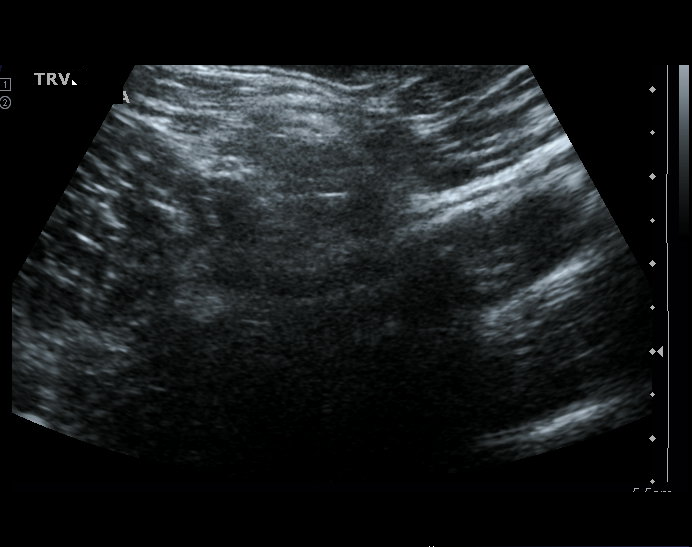
[im 10/12]
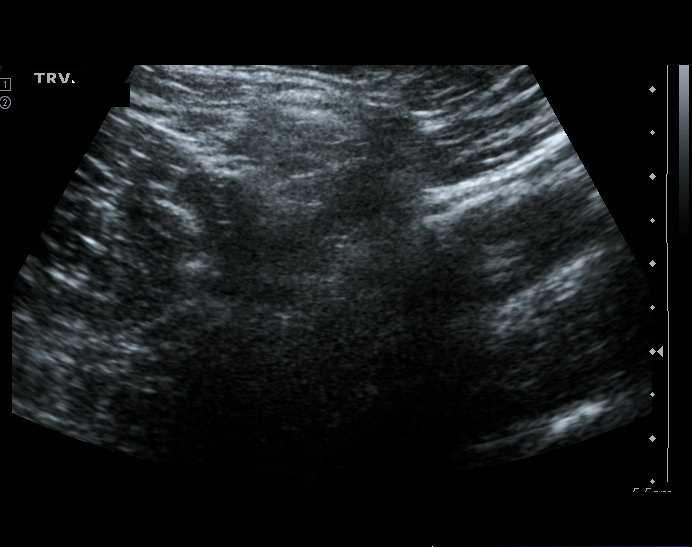
[im 11/12]
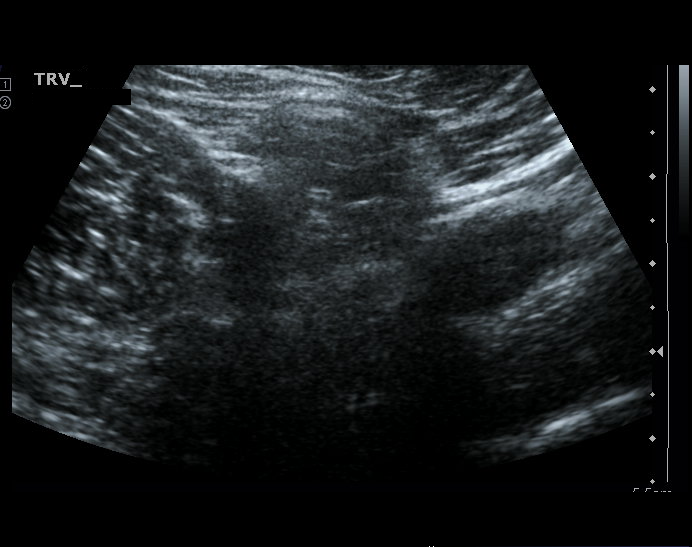
[im 12/12]
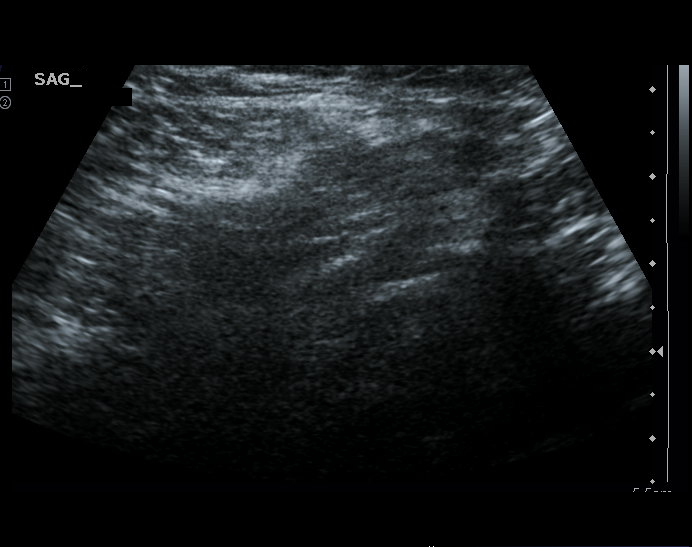

[12 of 12 positions shown; findings below may reference images not displayed]

FINDINGS: Initial evaluation of the left inguinal region is without
sonographic abnormality.

However, with Valsalva, a small fat containing left inguinal hernia
is suspected.

No mass or adenopathy is visualized.
IMPRESSION: Suspected small fat containing left inguinal hernia with Valsalva.
